# Patient Record
Sex: Female | Born: 1960 | Race: White | Hispanic: No | Marital: Married | Smoking: Former smoker
Health system: Southern US, Community
[De-identification: ages and names within clinical notes are randomized; demographics above are authoritative.]

## PROBLEM LIST (undated history)

## (undated) DIAGNOSIS — K219 Gastro-esophageal reflux disease without esophagitis: Secondary | ICD-10-CM

## (undated) DIAGNOSIS — M5090 Cervical disc disorder, unspecified, unspecified cervical region: Secondary | ICD-10-CM

## (undated) DIAGNOSIS — I809 Phlebitis and thrombophlebitis of unspecified site: Secondary | ICD-10-CM

## (undated) DIAGNOSIS — K2270 Barrett's esophagus without dysplasia: Secondary | ICD-10-CM

## (undated) DIAGNOSIS — F1110 Opioid abuse, uncomplicated: Secondary | ICD-10-CM

## (undated) DIAGNOSIS — F419 Anxiety disorder, unspecified: Secondary | ICD-10-CM

## (undated) DIAGNOSIS — F4310 Post-traumatic stress disorder, unspecified: Secondary | ICD-10-CM

## (undated) HISTORY — DX: Phlebitis and thrombophlebitis of unspecified site: I80.9

## (undated) HISTORY — PX: CYST REMOVAL HAND: SHX6279

## (undated) HISTORY — PX: WRIST SURGERY: SHX841

## (undated) HISTORY — DX: Barrett's esophagus without dysplasia: K22.70

## (undated) HISTORY — PX: SHOULDER SURGERY: SHX246

## (undated) HISTORY — DX: Cervical disc disorder, unspecified, unspecified cervical region: M50.90

## (undated) HISTORY — PX: CERVICAL SPINE SURGERY: SHX589

## (undated) HISTORY — DX: Opioid abuse, uncomplicated: F11.10

## (undated) HISTORY — DX: Gastro-esophageal reflux disease without esophagitis: K21.9

## (undated) HISTORY — DX: Post-traumatic stress disorder, unspecified: F43.10

## (undated) HISTORY — PX: FINGER SURGERY: SHX640

## (undated) HISTORY — PX: HIP SURGERY: SHX245

## (undated) HISTORY — DX: Anxiety disorder, unspecified: F41.9

---

## 2012-01-03 HISTORY — PX: COLONOSCOPY: SHX174

## 2013-10-21 DIAGNOSIS — S4380XA Sprain of other specified parts of unspecified shoulder girdle, initial encounter: Secondary | ICD-10-CM | POA: Insufficient documentation

## 2013-10-21 DIAGNOSIS — M75100 Unspecified rotator cuff tear or rupture of unspecified shoulder, not specified as traumatic: Secondary | ICD-10-CM | POA: Insufficient documentation

## 2014-02-24 DIAGNOSIS — R1313 Dysphagia, pharyngeal phase: Secondary | ICD-10-CM | POA: Insufficient documentation

## 2014-02-26 ENCOUNTER — Ambulatory Visit: Payer: Self-pay | Admitting: Gastroenterology

## 2014-03-17 ENCOUNTER — Ambulatory Visit: Payer: Self-pay | Admitting: Gastroenterology

## 2014-04-24 ENCOUNTER — Encounter: Payer: Self-pay | Admitting: Family Medicine

## 2014-04-27 LAB — SURGICAL PATHOLOGY

## 2014-05-08 DIAGNOSIS — M4722 Other spondylosis with radiculopathy, cervical region: Secondary | ICD-10-CM | POA: Insufficient documentation

## 2014-06-11 ENCOUNTER — Other Ambulatory Visit: Payer: Self-pay

## 2014-06-11 ENCOUNTER — Encounter: Payer: Self-pay | Admitting: Psychiatry

## 2014-06-11 ENCOUNTER — Ambulatory Visit (INDEPENDENT_AMBULATORY_CARE_PROVIDER_SITE_OTHER): Payer: 59 | Admitting: Psychiatry

## 2014-06-11 VITALS — BP 126/82 | HR 100 | Temp 99.3°F | Ht 62.0 in

## 2014-06-11 DIAGNOSIS — J069 Acute upper respiratory infection, unspecified: Secondary | ICD-10-CM | POA: Insufficient documentation

## 2014-06-11 DIAGNOSIS — K2270 Barrett's esophagus without dysplasia: Secondary | ICD-10-CM | POA: Insufficient documentation

## 2014-06-11 DIAGNOSIS — Z3490 Encounter for supervision of normal pregnancy, unspecified, unspecified trimester: Secondary | ICD-10-CM | POA: Insufficient documentation

## 2014-06-11 DIAGNOSIS — M5090 Cervical disc disorder, unspecified, unspecified cervical region: Secondary | ICD-10-CM | POA: Insufficient documentation

## 2014-06-11 DIAGNOSIS — F331 Major depressive disorder, recurrent, moderate: Secondary | ICD-10-CM | POA: Diagnosis not present

## 2014-06-11 DIAGNOSIS — O039 Complete or unspecified spontaneous abortion without complication: Secondary | ICD-10-CM | POA: Insufficient documentation

## 2014-06-11 DIAGNOSIS — K219 Gastro-esophageal reflux disease without esophagitis: Secondary | ICD-10-CM | POA: Insufficient documentation

## 2014-06-11 MED ORDER — LAMOTRIGINE 25 MG PO TABS: 25.0000 mg | ORAL_TABLET | Freq: Every day | ORAL | Status: DC

## 2014-06-11 MED ORDER — QUETIAPINE FUMARATE 50 MG PO TABS: 50.0000 mg | ORAL_TABLET | Freq: Two times a day (BID) | ORAL | Status: DC
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
## 2014-06-30 ENCOUNTER — Other Ambulatory Visit: Payer: Self-pay | Admitting: Psychiatry

## 2014-07-09 ENCOUNTER — Ambulatory Visit: Payer: 59 | Admitting: Psychiatry

## 2014-07-30 ENCOUNTER — Ambulatory Visit (INDEPENDENT_AMBULATORY_CARE_PROVIDER_SITE_OTHER): Payer: 59 | Admitting: Psychiatry

## 2014-07-30 ENCOUNTER — Encounter: Payer: Self-pay | Admitting: Psychiatry

## 2014-07-30 VITALS — BP 122/68 | HR 82 | Temp 98.4°F | Ht 62.0 in | Wt 154.8 lb

## 2014-07-30 DIAGNOSIS — F331 Major depressive disorder, recurrent, moderate: Secondary | ICD-10-CM

## 2014-07-30 MED ORDER — QUETIAPINE FUMARATE 100 MG PO TABS: 100.0000 mg | ORAL_TABLET | Freq: Every day | ORAL | Status: DC

## 2014-07-30 MED ORDER — BUSPIRONE HCL 10 MG PO TABS: 10.0000 mg | ORAL_TABLET | Freq: Two times a day (BID) | ORAL | Status: DC

## 2014-07-30 MED ORDER — PRAZOSIN HCL 1 MG PO CAPS: 1.0000 mg | ORAL_CAPSULE | Freq: Every day | ORAL | Status: DC

## 2014-07-30 MED ORDER — ESCITALOPRAM OXALATE 10 MG PO TABS: 10.0000 mg | ORAL_TABLET | Freq: Every day | ORAL | Status: DC
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
## 2014-07-31 DIAGNOSIS — M654 Radial styloid tenosynovitis [de Quervain]: Secondary | ICD-10-CM | POA: Insufficient documentation

## 2014-09-15 ENCOUNTER — Other Ambulatory Visit: Payer: Self-pay

## 2014-09-15 NOTE — Telephone Encounter (Signed)
left message with Kristen Curtis that she needs refills on lexapro, seroquel. pt last seen on  07-30-14 next appt on  10-08-14.  pt is out of meds.

## 2014-09-17 MED ORDER — QUETIAPINE FUMARATE 100 MG PO TABS: 100.0000 mg | ORAL_TABLET | Freq: Every day | ORAL | Status: DC

## 2014-09-17 MED ORDER — ESCITALOPRAM OXALATE 10 MG PO TABS: 10.0000 mg | ORAL_TABLET | Freq: Every day | ORAL | Status: DC

## 2014-10-01 ENCOUNTER — Ambulatory Visit: Payer: Self-pay | Admitting: Psychiatry

## 2014-10-08 ENCOUNTER — Ambulatory Visit: Payer: 59 | Admitting: Psychiatry

## 2014-10-08 ENCOUNTER — Encounter: Payer: Self-pay | Admitting: Psychiatry

## 2014-10-08 ENCOUNTER — Ambulatory Visit (INDEPENDENT_AMBULATORY_CARE_PROVIDER_SITE_OTHER): Payer: 59 | Admitting: Psychiatry

## 2014-10-08 VITALS — BP 118/82 | HR 103 | Temp 98.8°F | Ht 62.0 in | Wt 157.6 lb

## 2014-10-08 DIAGNOSIS — F411 Generalized anxiety disorder: Secondary | ICD-10-CM | POA: Diagnosis not present

## 2014-10-08 DIAGNOSIS — F330 Major depressive disorder, recurrent, mild: Secondary | ICD-10-CM

## 2014-10-08 MED ORDER — HYDROXYZINE PAMOATE 25 MG PO CAPS: 25.0000 mg | ORAL_CAPSULE | Freq: Two times a day (BID) | ORAL | Status: DC | PRN

## 2014-10-08 MED ORDER — BUSPIRONE HCL 10 MG PO TABS: 10.0000 mg | ORAL_TABLET | Freq: Every morning | ORAL | Status: DC

## 2014-10-08 MED ORDER — ESCITALOPRAM OXALATE 10 MG PO TABS: 10.0000 mg | ORAL_TABLET | Freq: Every day | ORAL | Status: DC

## 2014-10-08 MED ORDER — QUETIAPINE FUMARATE 50 MG PO TABS: 50.0000 mg | ORAL_TABLET | Freq: Two times a day (BID) | ORAL | Status: DC
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
## 2014-10-13 ENCOUNTER — Ambulatory Visit: Payer: Self-pay | Admitting: Family Medicine

## 2014-10-16 ENCOUNTER — Encounter: Payer: Self-pay | Admitting: Family Medicine

## 2014-10-16 ENCOUNTER — Ambulatory Visit (INDEPENDENT_AMBULATORY_CARE_PROVIDER_SITE_OTHER): Payer: 59 | Admitting: Family Medicine

## 2014-10-16 VITALS — BP 110/80 | HR 80 | Ht 62.0 in | Wt 155.0 lb

## 2014-10-16 DIAGNOSIS — R8299 Other abnormal findings in urine: Secondary | ICD-10-CM

## 2014-10-16 DIAGNOSIS — K2100 Gastro-esophageal reflux disease with esophagitis, without bleeding: Secondary | ICD-10-CM

## 2014-10-16 DIAGNOSIS — N3000 Acute cystitis without hematuria: Secondary | ICD-10-CM

## 2014-10-16 DIAGNOSIS — Z23 Encounter for immunization: Secondary | ICD-10-CM

## 2014-10-16 DIAGNOSIS — F418 Other specified anxiety disorders: Secondary | ICD-10-CM

## 2014-10-16 DIAGNOSIS — M797 Fibromyalgia: Secondary | ICD-10-CM | POA: Diagnosis not present

## 2014-10-16 DIAGNOSIS — R8290 Unspecified abnormal findings in urine: Secondary | ICD-10-CM

## 2014-10-16 DIAGNOSIS — F1110 Opioid abuse, uncomplicated: Secondary | ICD-10-CM

## 2014-10-16 DIAGNOSIS — E66811 Obesity, class 1: Secondary | ICD-10-CM | POA: Insufficient documentation

## 2014-10-16 DIAGNOSIS — F1111 Opioid abuse, in remission: Secondary | ICD-10-CM

## 2014-10-16 DIAGNOSIS — E559 Vitamin D deficiency, unspecified: Secondary | ICD-10-CM

## 2014-10-16 DIAGNOSIS — Z981 Arthrodesis status: Secondary | ICD-10-CM | POA: Diagnosis not present

## 2014-10-16 DIAGNOSIS — E669 Obesity, unspecified: Secondary | ICD-10-CM

## 2014-10-16 DIAGNOSIS — F4310 Post-traumatic stress disorder, unspecified: Secondary | ICD-10-CM | POA: Insufficient documentation

## 2014-10-16 LAB — POCT URINALYSIS DIPSTICK
BILIRUBIN UA: NEGATIVE
Blood, UA: NEGATIVE
Glucose, UA: NEGATIVE
KETONES UA: NEGATIVE
LEUKOCYTES UA: NEGATIVE
Nitrite, UA: NEGATIVE
Protein, UA: NEGATIVE
SPEC GRAV UA: 1.01
Urobilinogen, UA: 0.2
pH, UA: 6

## 2014-10-16 MED ORDER — HYDROXYZINE PAMOATE 25 MG PO CAPS: 25.0000 mg | ORAL_CAPSULE | Freq: Two times a day (BID) | ORAL | Status: DC | PRN

## 2014-10-16 NOTE — Progress Notes (Signed)
Date:  10/16/2014   Name:  Kristen Curtis   DOB:  [birth year]   MRN:  [id number]  PCP:  Schuyler AmorWilliam Wallace Cogliano, MD    Chief Complaint: Urinary Tract Infection and Fibromyalgia   History of Present Illness:  This is a 54 y.o. female reports cloudy urine past day, no other urinary sxs. Hx GERD with Barrett's on chronic PPI, last EGD showed no Barrett's per pt, interested in getting off PPI due to potential long term se's. Wants referral to rheum for fibromyalgia, initially dx'd 2001, sxs more severely lately. Tender over chest/back/hips/knees. Hx depression/anxiety/PTSD followed by Dr. Garnetta BuddyFaheem, generally well controlled on current regimen. Tetanus status unknown, gets flu imm at work. Has throat spasms well controlled on Levbid. No recent labs.  Review of Systems:  Review of Systems  Constitutional: Negative for fever and unexpected weight change.  Respiratory: Negative for cough and shortness of breath.   Cardiovascular: Negative for chest pain and leg swelling.  Gastrointestinal: Negative for abdominal pain.  Endocrine: Negative for polyuria.  Genitourinary: Negative for difficulty urinating.  Skin: Negative for rash.  Neurological: Negative for tremors, syncope, weakness, light-headedness and numbness.    Patient Active Problem List   Diagnosis Date Noted  . Fibromyalgia 10/16/2014  . Obesity (BMI 30.0-34.9) 10/16/2014  . PTSD (post-traumatic stress disorder) 10/16/2014  . Status post cervical spinal fusion 10/16/2014  . De Quervain's disease (radial styloid tenosynovitis) 07/31/2014  . Miscarriage 06/11/2014  . Barrett esophagus 06/11/2014  . Cervical disc disease 06/11/2014  . Acid reflux 06/11/2014  . Infection of the upper respiratory tract 06/11/2014  . Pregnancy 06/11/2014  . Cervical spondylosis with radiculopathy 05/08/2014  . Dysphagia, pharyngeal 02/24/2014  . Supraspinatus tendon tear 10/21/2013  . Detachment of glenoid labrum 10/21/2013    Prior to Admission  medications   Medication Sig Start Date End Date Taking? Authorizing Provider  busPIRone (BUSPAR) 10 MG tablet Take 1 tablet (10 mg total) by mouth every morning. 10/08/14  Yes Brandy HaleUzma Faheem, MD  escitalopram (LEXAPRO) 10 MG tablet Take 1 tablet (10 mg total) by mouth daily. 10/08/14  Yes Brandy HaleUzma Faheem, MD  hydrOXYzine (VISTARIL) 25 MG capsule Take 1 capsule (25 mg total) by mouth 2 (two) times daily as needed for anxiety. 10/16/14  Yes Schuyler AmorWilliam Miguelina Fore, MD  hyoscyamine (LEVBID) 0.375 MG 12 hr tablet  06/02/14  Yes Historical Provider, MD  omeprazole (PRILOSEC) 20 MG capsule TK ONE C PO QD 06/02/14  Yes Historical Provider, MD  QUEtiapine (SEROQUEL) 50 MG tablet Take 1 tablet (50 mg total) by mouth 2 (two) times daily. 10/08/14  Yes Brandy HaleUzma Faheem, MD    Allergies  Allergen Reactions  . Propoxyphene Rash  . Tetracycline Hives  . Tetracyclines & Related Hives  . Propoxyphene   . Tetracycline Hcl   . Tramadol Hives    Past Surgical History  Procedure Laterality Date  . Cervical spine surgery    . Shoulder surgery    . Finger surgery    . Cesarean section      x 2  . Hip surgery Left     x 2  . Colonoscopy  2014    no polyps- cleared for 10 yrs- Dr Colette RibasByrd  . Wrist surgery Right     DeQuervain surg  . Cyst removal hand Right     thumb    Social History  Substance Use Topics  . Smoking status: Former Smoker -- 0.25 packs/day    Types: Cigarettes    Start date: 10/07/1977  Quit date: 01/02/2013  . Smokeless tobacco: Never Used  . Alcohol Use: No    Family History  Problem Relation Age of Onset  . Lung cancer Mother   . Alcohol abuse Mother   . Cancer - Other Mother   . Lung cancer Maternal Grandmother   . Breast cancer Sister   . Prostate cancer Father   . Alcohol abuse Brother   . Drug abuse Brother     Medication list has been reviewed and updated.  Physical Examination: BP 110/80 mmHg  Pulse 80  Ht  (1.575 m)  Wt 155 lb (70.308 kg)  BMI 28.34 kg/m2  LMP    Physical Exam  Constitutional: She is oriented to person, place, and time. She appears well-developed and well-nourished.  Cardiovascular: Normal rate, regular rhythm and normal heart sounds.   Pulmonary/Chest: Effort normal and breath sounds normal.  Musculoskeletal: She exhibits no edema.  Trigger points trapezius, interscapular, paralumbar  Neurological: She is alert and oriented to person, place, and time. Coordination normal.  Skin: Skin is warm and dry.  Psychiatric: She has a normal mood and affect. Her behavior is normal.  Nursing note and vitals reviewed.   Assessment and Plan:  1. Cloudy urine UA negative will send for cx - Urine Culture - POCT urinalysis dipstick  2. Fibromyalgia Discussed trial of Lyrica, will wait to see what rheum recommends - Comprehensive metabolic panel - CBC - B12 - Ambulatory referral to Rheumatology  3. Gastroesophageal reflux disease with esophagitis Followed by GI, discuss taper off or conversion to H2 blocker next GI visit - Lipid panel - Vitamin D (25 hydroxy)  4. Obesity (BMI 30.0-34.9) Exercise/weight loss discussed  5. Depression with anxiety Followed by psych  6. Status post cervical spinal fusion No current neck pain  Return in about 4 weeks (around 11/13/2014).  [name] MD [facility]  10/16/2014

## 2014-10-17 LAB — COMPREHENSIVE METABOLIC PANEL
ALBUMIN: 4.4 g/dL (ref 3.5–5.5)
ALK PHOS: 99 IU/L (ref 39–117)
ALT: 12 IU/L (ref 0–32)
AST: 15 IU/L (ref 0–40)
Albumin/Globulin Ratio: 1.8 (ref 1.1–2.5)
BILIRUBIN TOTAL: 0.2 mg/dL (ref 0.0–1.2)
BUN / CREAT RATIO: 14 (ref 9–23)
BUN: 12 mg/dL (ref 6–24)
CHLORIDE: 100 mmol/L (ref 97–108)
CO2: 26 mmol/L (ref 18–29)
Calcium: 10 mg/dL (ref 8.7–10.2)
Creatinine, Ser: 0.86 mg/dL (ref 0.57–1.00)
GFR calc Af Amer: 90 mL/min/{1.73_m2} (ref >59)
GFR calc non Af Amer: 78 mL/min/{1.73_m2} (ref >59)
GLOBULIN, TOTAL: 2.5 g/dL (ref 1.5–4.5)
Glucose: 65 mg/dL (ref 65–99)
Potassium: 5.4 mmol/L — ABNORMAL HIGH (ref 3.5–5.2)
SODIUM: 140 mmol/L (ref 134–144)
Total Protein: 6.9 g/dL (ref 6.0–8.5)

## 2014-10-17 LAB — CBC
Hematocrit: 41.4 % (ref 34.0–46.6)
Hemoglobin: 13.9 g/dL (ref 11.1–15.9)
MCH: 32.2 pg (ref 26.6–33.0)
MCHC: 33.6 g/dL (ref 31.5–35.7)
MCV: 96 fL (ref 79–97)
PLATELETS: 326 10*3/uL (ref 150–379)
RBC: 4.32 x10E6/uL (ref 3.77–5.28)
RDW: 13.9 % (ref 12.3–15.4)
WBC: 11 10*3/uL — AB (ref 3.4–10.8)

## 2014-10-17 LAB — LIPID PANEL
Chol/HDL Ratio: 4.5 ratio units — ABNORMAL HIGH (ref 0.0–4.4)
Cholesterol, Total: 276 mg/dL — ABNORMAL HIGH (ref 100–199)
HDL: 62 mg/dL (ref >39)
LDL Calculated: 163 mg/dL — ABNORMAL HIGH (ref 0–99)
TRIGLYCERIDES: 256 mg/dL — AB (ref 0–149)
VLDL Cholesterol Cal: 51 mg/dL — ABNORMAL HIGH (ref 5–40)

## 2014-10-17 LAB — VITAMIN B12: Vitamin B-12: 1190 pg/mL — ABNORMAL HIGH (ref 211–946)

## 2014-10-17 LAB — VITAMIN D 25 HYDROXY (VIT D DEFICIENCY, FRACTURES): VIT D 25 HYDROXY: 22.1 ng/mL — AB (ref 30.0–100.0)

## 2014-10-18 LAB — URINE CULTURE

## 2014-10-19 ENCOUNTER — Telehealth: Payer: Self-pay

## 2014-10-19 ENCOUNTER — Other Ambulatory Visit: Payer: Self-pay | Admitting: Family Medicine

## 2014-10-19 DIAGNOSIS — E559 Vitamin D deficiency, unspecified: Secondary | ICD-10-CM | POA: Insufficient documentation

## 2014-10-19 MED ORDER — SULFAMETHOXAZOLE-TRIMETHOPRIM 800-160 MG PO TABS: 1.0000 | ORAL_TABLET | Freq: Two times a day (BID) | ORAL | Status: DC

## 2014-10-19 MED ORDER — VITAMIN D 50 MCG (2000 UT) PO CAPS: 1.0000 | ORAL_CAPSULE | Freq: Every day | ORAL | Status: AC

## 2014-10-19 NOTE — Telephone Encounter (Signed)
Sent message

## 2014-10-19 NOTE — Addendum Note (Signed)
Addended by: Schuyler AmorPLONK, Shaquanda Graves on: 10/19/2014 10:29 AM   Modules accepted: Orders, SmartSet

## 2014-10-19 NOTE — Telephone Encounter (Signed)
OK 

## 2014-10-19 NOTE — Telephone Encounter (Signed)
See note attached to urine cx, Bactrim rx sent to pharmacy.

## 2014-11-26 ENCOUNTER — Other Ambulatory Visit: Payer: Self-pay | Admitting: Psychiatry

## 2014-12-08 ENCOUNTER — Ambulatory Visit: Payer: 59 | Admitting: Psychiatry

## 2014-12-20 DIAGNOSIS — M67439 Ganglion, unspecified wrist: Secondary | ICD-10-CM | POA: Insufficient documentation

## 2014-12-29 NOTE — Progress Notes (Signed)
rx was discontinued.  Called pharmacy to notified.

## 2014-12-30 ENCOUNTER — Other Ambulatory Visit: Payer: Self-pay | Admitting: Psychiatry

## 2014-12-31 ENCOUNTER — Telehealth: Payer: Self-pay

## 2014-12-31 NOTE — Telephone Encounter (Signed)
pt called states she needs a refill on her seroquel.  pt states she will not have enough to do until her next appt on  01-04-14

## 2014-12-31 NOTE — Telephone Encounter (Signed)
called in enough medication to due until pt can come in for appt.  pt next appt  01-04-14 last seen on  10-08-14.  rx was called in for #12 seroquel 50mg  take one twice daily.

## 2014-12-31 NOTE — Telephone Encounter (Signed)
spoke with pt. pt was told that there was no doctor in the office until 01-04-14.  called in just enought to due until her appt on  01-04-14

## 2015-01-05 ENCOUNTER — Encounter: Payer: Self-pay | Admitting: Psychiatry

## 2015-01-05 ENCOUNTER — Ambulatory Visit (INDEPENDENT_AMBULATORY_CARE_PROVIDER_SITE_OTHER): Payer: 59 | Admitting: Psychiatry

## 2015-01-05 VITALS — BP 118/72 | HR 100 | Temp 99.3°F | Ht 62.0 in | Wt 159.8 lb

## 2015-01-05 DIAGNOSIS — F331 Major depressive disorder, recurrent, moderate: Secondary | ICD-10-CM

## 2015-01-05 MED ORDER — QUETIAPINE FUMARATE 50 MG PO TABS: 50.0000 mg | ORAL_TABLET | Freq: Three times a day (TID) | ORAL | Status: DC

## 2015-01-05 NOTE — Progress Notes (Signed)
Pharmacy notified.

## 2015-01-05 NOTE — Progress Notes (Signed)
BH  MD  Progress Note  Patient Identification: Kristen Curtis MRN:  [id number] Date of Evaluation:  01/05/2015 [facility]  Chief Complaint:   Chief Complaint    Follow-up; Medication Refill     Visit Diagnosis:    ICD-9-CM ICD-10-CM   1. MDD (major depressive disorder), recurrent episode, moderate (HCC) 296.32 F33.1    Diagnosis:   Patient Active Problem List   Diagnosis Date Noted  . Ganglion cyst of wrist [M67.439] 12/20/2014  . Vitamin D deficiency [E55.9] 10/19/2014  . Fibromyalgia [M79.7] 10/16/2014  . PTSD (post-traumatic stress disorder) [F43.10] 10/16/2014  . Status post cervical spinal fusion [Z98.1] 10/16/2014  . Depression with anxiety [F41.8] 10/16/2014  . Obesity, Class I, BMI 30-34.9 [E66.9] 10/16/2014  . Hx of opioid abuse [F11.10] 10/16/2014  . Barrett esophagus [K22.70] 06/11/2014  . Acid reflux [K21.9] 06/11/2014  . Dysphagia, pharyngeal [R13.13] 02/24/2014   History of Present Illness:    Pt is a 55 yo married female who presented for follow up. Patient reported that she recently had the surgery in her right hand due to the reaction to the medication. She reported that they worked on the fascia and the compartment syndrome in her hand. She is recuperating well from her surgery. Patient reported that she has gradually tapered herself out of the Lexapro and is only taking the Seroquel at bedtime. Patient stated that the medication is helping her well. She takes 100 mg of Seroquel at bedtime and 50 mg on a when necessary basis during the day when she feels anxious She reported that her anxiety symptoms are under control. She does not want to take several different medications. She has never tried Vistaril which was prescribed for her anxiety symptoms. She reported that her mood symptoms are  improving. She currently denied having any anxiety anger or paranoia. She appeared calm during the interview.   Past Medical History:  Past Medical  History  Diagnosis Date  . Barrett's esophagus   . Esophageal reflux   . Cervical disc disease   . PTSD (post-traumatic stress disorder)   . Opioid abuse   . Phlebitis   . Anxiety     Past Surgical History  Procedure Laterality Date  . Cervical spine surgery    . Shoulder surgery    . Finger surgery    . Cesarean section      x 2  . Hip surgery Left     x 2  . Colonoscopy  2014    no polyps- cleared for 10 yrs- Dr Colette RibasByrd  . Wrist surgery Right     DeQuervain surg  . Cyst removal hand Right     thumb   Family History:  Family History  Problem Relation Age of Onset  . Lung cancer Mother   . Alcohol abuse Mother   . Cancer - Other Mother   . Lung cancer Maternal Grandmother   . Breast cancer Sister   . Prostate cancer Father   . Alcohol abuse Brother   . Drug abuse Brother    Social History:   Social History   Social History  . Marital Status: Married    Spouse Name: N/A  . Number of Children: 2  . Years of Education: N/A   Occupational History  . unemployed    Social History Main Topics  . Smoking status: Former Smoker -- 0.25 packs/day    Types: Cigarettes    Start date: 10/07/1977    Quit date: 01/02/2013  .  Smokeless tobacco: Never Used  . Alcohol Use: No  . Drug Use: No     Comment: uses to smoke marjuana over a year ago  . Sexual Activity: Yes    Birth Control/ Protection: None   Other Topics Concern  . None   Social History Narrative   Additional Social History:   Married x 3 years. No children. 2 children from previous marriage. Husband works in [employer] representative. Moved to [place] - 6 years ago. She was born in [place] and relocated to [facility]. She has been married x 3. Has no pending legal charges.   Musculoskeletal: Strength & Muscle Tone: within normal limits Gait & Station: normal Patient leans: N/A  Psychiatric Specialty Exam: Anxiety Symptoms include nervous/anxious behavior and palpitations.      Review of Systems  Cardiovascular:  Positive for palpitations.  Musculoskeletal: Positive for neck pain.  Psychiatric/Behavioral: Positive for depression. The patient is nervous/anxious.   All other systems reviewed and are negative.   Blood pressure 118/72, pulse 100, temperature 99.3 F (37.4 C), temperature source Tympanic, height 5\' 2"  (1.575 m), weight 159 lb 12.8 oz (72.485 kg), SpO2 91 %.Body mass index is 29.22 kg/(m^2).  General Appearance: Casual  Eye Contact:  Fair  Speech:  Slow  Volume:  Normal  Mood:  Depressed  Affect:  Congruent  Thought Process:  Coherent and Logical  Orientation:  Full (Time, Place, and Person)  Thought Content:  WDL  Suicidal Thoughts:  No  Homicidal Thoughts:  No  Memory:  Immediate;   Fair  Judgement:  Fair  Insight:  Fair  Psychomotor Activity:  Normal  Concentration:  Fair  Recall:  [employer] of Knowledge:Fair  Language: Fair  Akathisia:  No  Handed:  Right  AIMS (if indicated):  none  Assets:  Communication Skills Desire for Improvement  ADL's:  Intact  Cognition: WNL  Sleep:  5   Is the patient at risk to self?  No. Has the patient been a risk to self in the past 6 months?  No. Has the patient been a risk to self within the distant past?  No. Is the patient a risk to others?  No. Has the patient been a risk to others in the past 6 months?  No. Has the patient been a risk to others within the distant past?  No.  Allergies:   Allergies  Allergen Reactions  . Ciprofloxacin Other (See Comments)    CAUSED DAMAGE TO TENDONS IN HANDS  . Dexbrompheniramine-Pseudoeph Hives  . Methadone Rash  . Other Hives  . Propoxyphene Rash  . Propoxyphene Rash  . Tetracycline Hives  . Tetracyclines & Related Hives  . Tetracycline Hcl   . Tramadol Hives   Current Medications: Current Outpatient Prescriptions  Medication Sig Dispense Refill  . baclofen (LIORESAL) 10 MG tablet TK 1 T PO TID.  0  . busPIRone (BUSPAR) 10 MG tablet Take 1 tablet (10 mg total) by mouth every  morning. 30 tablet 1  . Cholecalciferol (VITAMIN D) 2000 UNITS CAPS Take 1 capsule (2,000 Units total) by mouth daily. 30 capsule   . escitalopram (LEXAPRO) 10 MG tablet Take 1 tablet (10 mg total) by mouth daily. 30 tablet 1  . HYDROcodone-acetaminophen (NORCO) 10-325 MG tablet TK 1 T PO  Q 6 H PRN P  0  . hydrOXYzine (VISTARIL) 25 MG capsule Take 1 capsule (25 mg total) by mouth 2 (two) times daily as needed for anxiety. 60 capsule 0  . hyoscyamine (LEVBID)  0.375 MG 12 hr tablet   0  . omeprazole (PRILOSEC) 20 MG capsule TK ONE C PO QD  11  . oxyCODONE (OXY IR/ROXICODONE) 5 MG immediate release tablet TK 1 T PO  Q 4 H PRN  0  . QUEtiapine (SEROQUEL) 50 MG tablet TAKE 1 TABLET(50 MG) BY MOUTH TWICE DAILY 60 tablet 0  . sulfamethoxazole-trimethoprim (BACTRIM DS,SEPTRA DS) 800-160 MG tablet Take 1 tablet by mouth 2 (two) times daily. 6 tablet 0   No current facility-administered medications for this visit.      Substance Abuse History in the last 12 months:  No.  Consequences of Substance Abuse: Negative  Medical Decision Making:  Established Problem, Worsening (2)  Treatment Plan Summary: Medication management   Discussed with patient or the medications treatment risk benefits and alternatives  Mood symptoms She will continue on Seroquel 100 mg at bedtime and 25-50 mg during the day as needed for mood and anxiety symptoms.    She demonstrated understanding She will  follow-up in three  month or earlier  depending on her symptoms      Atypicals: The patient was counseled on the risks, benefits, and alternatives to treatment with an atypical antipsychotic agent.  Risks discussed include metabolic side effects: weight gain, elevations in blood sugar and lipids, and increased risk of diabetes.  The patient was also advised of the risks of dystonia, akathisia, parkinsonism, tardive dyskinesia, and prolactin elevation.  The patient was able to demonstrate understanding of these  risks and provided informed consent to start the medication.     More than 50% of the time spent in psychoeducation, counseling and coordination of care.  Time spent with the patient 25 minutes   This note was generated in part or whole with voice recognition software. Voice regonition is usually quite accurate but there are transcription errors that can and very often do occur. I apologize for any typographical errors that were not detected and corrected.    Brandy Hale, MD    1/3/20179:24 AM

## 2015-01-05 NOTE — Telephone Encounter (Signed)
Done

## 2015-02-03 ENCOUNTER — Encounter: Payer: Self-pay | Admitting: Oncology

## 2015-02-03 ENCOUNTER — Inpatient Hospital Stay: Payer: Managed Care, Other (non HMO) | Attending: Oncology | Admitting: Oncology

## 2015-02-03 DIAGNOSIS — Z79899 Other long term (current) drug therapy: Secondary | ICD-10-CM | POA: Diagnosis not present

## 2015-02-03 DIAGNOSIS — Z1589 Genetic susceptibility to other disease: Secondary | ICD-10-CM | POA: Insufficient documentation

## 2015-02-03 DIAGNOSIS — Z87891 Personal history of nicotine dependence: Secondary | ICD-10-CM | POA: Insufficient documentation

## 2015-02-03 DIAGNOSIS — F1110 Opioid abuse, uncomplicated: Secondary | ICD-10-CM | POA: Insufficient documentation

## 2015-02-03 DIAGNOSIS — F4310 Post-traumatic stress disorder, unspecified: Secondary | ICD-10-CM | POA: Diagnosis not present

## 2015-02-03 DIAGNOSIS — K2270 Barrett's esophagus without dysplasia: Secondary | ICD-10-CM | POA: Insufficient documentation

## 2015-02-03 DIAGNOSIS — F419 Anxiety disorder, unspecified: Secondary | ICD-10-CM | POA: Diagnosis not present

## 2015-02-03 DIAGNOSIS — Z808 Family history of malignant neoplasm of other organs or systems: Secondary | ICD-10-CM | POA: Insufficient documentation

## 2015-02-03 DIAGNOSIS — Z800 Family history of malignant neoplasm of digestive organs: Secondary | ICD-10-CM | POA: Diagnosis not present

## 2015-02-03 DIAGNOSIS — Z315 Encounter for genetic counseling: Secondary | ICD-10-CM | POA: Insufficient documentation

## 2015-02-03 DIAGNOSIS — K219 Gastro-esophageal reflux disease without esophagitis: Secondary | ICD-10-CM | POA: Diagnosis not present

## 2015-02-03 DIAGNOSIS — Z803 Family history of malignant neoplasm of breast: Secondary | ICD-10-CM | POA: Diagnosis not present

## 2015-02-03 DIAGNOSIS — M5030 Other cervical disc degeneration, unspecified cervical region: Secondary | ICD-10-CM | POA: Insufficient documentation

## 2015-02-07 NOTE — Progress Notes (Signed)
[facility]  Telephone:(336) [phone] Fax:(336) [phone]  ID: Kristen Curtis OB: 1960/03/23  MR#: [id number]  AOZ#:[id number]  [name], [name]  CHIEF COMPLAINT:  Chief Complaint  Patient presents with  . Genetic Evaluation  . New Evaluation    INTERVAL HISTORY: Patient is a 55 year old female who is referred for further evaluation and consideration of genetic testing. She has a sister who was diagnosed with breast cancer in her 55s and was found to be BCRA positive. Patient has no personal history of malignancy. She currently feels well and is asymptomatic. Her father had prostate cancer at age 37, but the remainder of her father's family history is unknown. She had a maternal great aunt as well as maternal cousin both with breast cancer. Her maternal grandmother had bladder and lung cancer. Mother was diagnosed with lung cancer twice. Patient feels at her baseline and offers no specific complaints today.  REVIEW OF SYSTEMS:   Review of Systems  Constitutional: Negative.   Respiratory: Negative.   Cardiovascular: Negative.   Gastrointestinal: Negative.   Musculoskeletal: Negative.   Neurological: Negative.     As per HPI. Otherwise, a complete review of systems is negatve.  PAST MEDICAL HISTORY: Past Medical History  Diagnosis Date  . Barrett's esophagus   . Esophageal reflux   . Cervical disc disease   . PTSD (post-traumatic stress disorder)   . Opioid abuse   . Phlebitis   . Anxiety     PAST SURGICAL HISTORY: Past Surgical History  Procedure Laterality Date  . Cervical spine surgery    . Shoulder surgery    . Finger surgery    . Cesarean section      x 2  . Hip surgery Left     x 2  . Colonoscopy  2014    no polyps- cleared for 10 yrs- Dr Colette Ribas  . Wrist surgery Right     DeQuervain surg  . Cyst removal hand Right     thumb    FAMILY HISTORY Family History  Problem Relation Age  of Onset  . Lung cancer Mother 52  . Alcohol abuse Mother   . Cancer - Other Mother   . Lung cancer Maternal Grandmother   . Breast cancer Sister 71    age 53 and 56  . Prostate cancer Father 55  . Alcohol abuse Brother   . Drug abuse Brother   . Prostate cancer Maternal Uncle 60  . Bladder Cancer Maternal Grandmother 72  . Lung cancer Paternal Grandmother 29       ADVANCED DIRECTIVES:    HEALTH MAINTENANCE: Social History  Substance Use Topics  . Smoking status: Former Smoker -- 0.25 packs/day    Types: Cigarettes    Start date: 10/07/1977    Quit date: 01/02/2013  . Smokeless tobacco: Never Used  . Alcohol Use: No     Colonoscopy:  PAP:  Bone density:  Lipid panel:  Allergies  Allergen Reactions  . Ciprofloxacin Other (See Comments)    Ruptured tendon CAUSED DAMAGE TO TENDONS IN HANDS  . Dexbrompheniramine-Pseudoeph Hives  . Other Hives  . Propoxyphene Rash  . Propoxyphene Rash  . Tetracycline Hives  . Tetracyclines & Related Hives  . Tetracycline Hcl   . Tramadol Hives    Current Outpatient Prescriptions  Medication Sig Dispense Refill  . baclofen (LIORESAL) 10 MG tablet TK 1 T PO TID.  0  . Biotin 1000 MCG tablet Take  by mouth.    . Cholecalciferol (VITAMIN D) 2000 UNITS CAPS Take 1 capsule (2,000 Units total) by mouth daily. 30 capsule   . Cyanocobalamin (VITAMIN B 12) 100 MCG LOZG Take by mouth.    . hyoscyamine (LEVBID) 0.375 MG 12 hr tablet   0  . nystatin cream (MYCOSTATIN) APP TOPICALLY BID  0  . omeprazole (PRILOSEC) 20 MG capsule TK ONE C PO QD  11  . QUEtiapine (SEROQUEL) 50 MG tablet Take 1 tablet (50 mg total) by mouth 3 (three) times daily. 90 tablet 2  . HYDROcodone-acetaminophen (NORCO) 10-325 MG tablet Reported on 02/03/2015  0  . oxyCODONE (OXY IR/ROXICODONE) 5 MG immediate release tablet Reported on 02/03/2015  0   No current facility-administered medications for this visit.    OBJECTIVE: There were no vitals filed for this visit.    There is no weight on file to calculate BMI.    ECOG FS:0 - Asymptomatic  General: Well-developed, well-nourished, no acute distress. Eyes: Pink conjunctiva, anicteric sclera. Musculoskeletal: No edema, cyanosis, or clubbing. Neuro: Alert, answering all questions appropriately. Cranial nerves grossly intact. Skin: No rashes or petechiae noted. Psych: Normal affect.  LAB RESULTS:  Lab Results  Component Value Date   NA 140 10/16/2014   K 5.4* 10/16/2014   CL 100 10/16/2014   CO2 26 10/16/2014   GLUCOSE 65 10/16/2014   BUN 12 10/16/2014   CREATININE 0.86 10/16/2014   CALCIUM 10.0 10/16/2014   PROT 6.9 10/16/2014   ALBUMIN 4.4 10/16/2014   AST 15 10/16/2014   ALT 12 10/16/2014   ALKPHOS 99 10/16/2014   BILITOT 0.2 10/16/2014   GFRNONAA 78 10/16/2014   GFRAA 90 10/16/2014    Lab Results  Component Value Date   WBC 11.0* 10/16/2014   HCT 41.4 10/16/2014   MCV 96 10/16/2014   PLT 326 10/16/2014     STUDIES: No results found.  ASSESSMENT: Family history BCRA positive.  PLAN:    1. Family history: Unclear if BCRA gene is paternal or maternal. Patient has no personal history of malignancy, but given a sister with breast cancer in her 81s and BCRA positive, it was recommended that patient proceed with testing. We have asked that she obtain her sister's official results. Patient has previously stated and continues to say that she will refuse any further mammograms even if her genetic testing is positive. She is also has stated she will likely refuse breast MRI.  She would consider bilateral mastectomy with immediate reconstruction for prophylaxis. Patient has tubal ligation and is also postmenopausal, therefore we also discussed the possibility of a total hysterectomy for ovarian cancer prophylaxis if she is positive. She has 2 sons and has agreed to recommend them to get testing if her testing is positive. Have also recommended that her brother be tested. No follow-up has been  scheduled at this time. If positive, patient will return to clinic to discuss the results and malignancy prophylactic options. If negative, patient understands she is at higher risk than the general population for developing breast cancer given her family history, but she stated again she will likely continue to refuse mammograms AGAINST MEDICAL ADVICE.  Approximately 45 minutes was spent in discussion of which greater than 50% was consultation.  Patient expressed understanding and was in agreement with this plan. She also understands that She can call clinic at any time with any questions, concerns, or complaints.   Jeralyn Ruths, MD   02/07/2015 9:19 AM

## 2015-03-18 ENCOUNTER — Telehealth: Payer: Self-pay | Admitting: Psychiatry

## 2015-03-18 NOTE — Telephone Encounter (Signed)
Called Dr Levan [facility]ristry Bark at [facility]. She is her Rheumatologist. She Lm earlier and stated that pt has been requesting Ambien as she was c/o insomnia. Dr Leontine LocketBark started her on Ambien 5 mg daily. She was given 4 months supply.

## 2015-04-06 ENCOUNTER — Ambulatory Visit: Payer: Self-pay | Admitting: Psychiatry

## 2015-04-15 ENCOUNTER — Encounter: Payer: Self-pay | Admitting: *Deleted

## 2015-04-15 NOTE — Progress Notes (Signed)
Patient informed of genetic testing results. No follow up needed.

## 2015-04-26 ENCOUNTER — Ambulatory Visit: Payer: Self-pay | Admitting: Psychiatry

## 2015-11-08 ENCOUNTER — Encounter: Payer: Self-pay | Admitting: Psychiatry

## 2015-11-08 ENCOUNTER — Ambulatory Visit (INDEPENDENT_AMBULATORY_CARE_PROVIDER_SITE_OTHER): Payer: Self-pay | Admitting: Psychiatry

## 2015-11-08 VITALS — BP 100/62 | HR 77 | Ht 62.0 in | Wt 134.4 lb

## 2015-11-08 DIAGNOSIS — F39 Unspecified mood [affective] disorder: Secondary | ICD-10-CM

## 2015-11-08 MED ORDER — QUETIAPINE FUMARATE 50 MG PO TABS: 50.0000 mg | ORAL_TABLET | Freq: Every day | ORAL | 1 refills | Status: DC

## 2015-11-08 NOTE — Progress Notes (Signed)
BH  MD  Progress Note  Patient Identification: Kristen Curtis MRN:  [id number] Date of Evaluation:  11/08/2015 Referral Source: [facility]  Chief Complaint:    Visit Diagnosis:    ICD-9-CM ICD-10-CM   1. Episodic mood disorder (HCC) 296.90 F39    Diagnosis:   Patient Active Problem List   Diagnosis Date Noted  . Ganglion cyst of wrist [M67.439] 12/20/2014  . Vitamin D deficiency [E55.9] 10/19/2014  . Fibromyalgia [M79.7] 10/16/2014  . PTSD (post-traumatic stress disorder) [F43.10] 10/16/2014  . Status post cervical spinal fusion [Z98.1] 10/16/2014  . Depression with anxiety [F41.8] 10/16/2014  . Obesity, Class I, BMI 30-34.9 [E66.9] 10/16/2014  . Hx of opioid abuse [Z87.898] 10/16/2014  . Barrett esophagus [K22.70] 06/11/2014  . Acid reflux [K21.9] 06/11/2014  . Dysphagia, pharyngeal [R13.13] 02/24/2014   History of Present Illness:    Pt is a 55 yo married female who presented for follow up. She was last seen in January. She reported that she was having a surgery  in her shoulder. She reported that she was [facility] who has started her on Paxil. Patient reported that she was taking Seroquel in the past and initially it caused her weight gain but now she wants to be restarted back on the medication as she tolerated the medication was helpful. She reported that she was taking Seroquel up to 3 times daily in the past. She feels that the medication helped with her anxiety symptoms. She was talking about having panic attacks although she appeared calm and alert during the interview. She currently denied having any suicidal ideations or plans. She is planning to help and the surgery in her shoulder due to chronic pain. She denied having any suicidal ideations or plans. She denied having any perceptual disturbances at this time.     Past Medical History:  Past Medical History:  Diagnosis Date  . Anxiety   . Barrett's esophagus   . Cervical  disc disease   . Esophageal reflux   . Opioid abuse   . Phlebitis   . PTSD (post-traumatic stress disorder)     Past Surgical History:  Procedure Laterality Date  . CERVICAL SPINE SURGERY    . CESAREAN SECTION     x 2  . COLONOSCOPY  2014   no polyps- cleared for 10 yrs- Dr Colette RibasByrd  . CYST REMOVAL HAND Right    thumb  . FINGER SURGERY    . HIP SURGERY Left    x 2  . SHOULDER SURGERY    . WRIST SURGERY Right    DeQuervain surg   Family History:  Family History  Problem Relation Age of Onset  . Lung cancer Mother 6667  . Alcohol abuse Mother   . Cancer - Other Mother   . Lung cancer Maternal Grandmother   . Breast cancer Sister 2144    age 53 and 3849  . Prostate cancer Father 3054  . Alcohol abuse Brother   . Drug abuse Brother   . Prostate cancer Maternal Uncle 60  . Bladder Cancer Maternal Grandmother 72  . Lung cancer Paternal Grandmother 3777   Social History:   Social History   Social History  . Marital status: Married    Spouse name: N/A  . Number of children: 2  . Years of education: N/A   Occupational History  . unemployed    Social History Main Topics  . Smoking status: Former Smoker    Packs/day:  0.10    Types: Cigarettes    Start date: 10/07/1977    Quit date: 01/02/2013  . Smokeless tobacco: Never Used  . Alcohol use No  . Drug use: No     Comment: uses to smoke marjuana over a year ago  . Sexual activity: Yes    Partners: Male    Birth control/ protection: None, Post-menopausal   Other Topics Concern  . None   Social History Narrative  . None   Additional Social History:   Married x 3 years. No children. 2 children from previous marriage. Husband works in [employer] representative[employer]. Moved to [place] 6 years ago. [place] She has been married x 3. Has no pending legal charges.   Musculoskeletal: Strength & Muscle Tone: within normal limits Gait & Station: normal Patient leans: N/A  Psychiatric Specialty Exam: Anxiety   Symptoms include nervous/anxious behavior and palpitations.      Review of Systems  Cardiovascular: Positive for palpitations.  Musculoskeletal: Positive for neck pain.  Psychiatric/Behavioral: Positive for depression. The patient is nervous/anxious.   All other systems reviewed and are negative.   Blood pressure 100/62, pulse 77, height 5\' 2"  (1.575 m), weight 134 lb 6.4 oz (61 kg).Body mass index is 24.58 kg/m.  General Appearance: Casual  Eye Contact:  Fair  Speech:  Slow  Volume:  Normal  Mood:  Euthymic  Affect:  Congruent  Thought Process:  Coherent and Logical  Orientation:  Full (Time, Place, and Person)  Thought Content:  WDL  Suicidal Thoughts:  No  Homicidal Thoughts:  No  Memory:  Immediate;   Fair  Judgement:  Fair  Insight:  Fair  Psychomotor Activity:  Normal  Concentration:  Fair  Recall:  [employer]Fair  Fund of Knowledge:Fair  Language: Fair  Akathisia:  No  Handed:  Right  AIMS (if indicated):  none  Assets:  Communication Skills Desire for Improvement  ADL's:  Intact  Cognition: WNL  Sleep:  5   Is the patient at risk to self?  No. Has the patient been a risk to self in the past 6 months?  No. Has the patient been a risk to self within the distant past?  No. Is the patient a risk to others?  No. Has the patient been a risk to others in the past 6 months?  No. Has the patient been a risk to others within the distant past?  No.  Allergies:   Allergies  Allergen Reactions  . Ciprofloxacin Other (See Comments)    Ruptured tendon CAUSED DAMAGE TO TENDONS IN HANDS  . Dexbrompheniramine-Pseudoeph Hives  . Other Hives  . Propoxyphene Rash  . Propoxyphene Rash  . Tetracycline Hives  . Tetracyclines & Related Hives  . Tetracycline Hcl   . Tramadol Hives   Current Medications: Current Outpatient Prescriptions  Medication Sig Dispense Refill  . baclofen (LIORESAL) 10 MG tablet TK 1 T PO TID.  0  . Biotin 1000 MCG tablet Take by mouth.    .  Cholecalciferol (VITAMIN D) 2000 UNITS CAPS Take 1 capsule (2,000 Units total) by mouth daily. 30 capsule   . Cyanocobalamin (VITAMIN B 12) 100 MCG LOZG Take by mouth.    . hyoscyamine (LEVBID) 0.375 MG 12 hr tablet   0  . nystatin cream (MYCOSTATIN) APP TOPICALLY BID  0  . omeprazole (PRILOSEC) 20 MG capsule TK ONE C PO QD  11  . QUEtiapine (SEROQUEL) 50 MG tablet Take 1 tablet (  50 mg total) by mouth at bedtime. 30 tablet 1   No current facility-administered medications for this visit.       Substance Abuse History in the last 12 months:  No.  Consequences of Substance Abuse: Negative  Medical Decision Making:  Established Problem, Worsening (2)  Treatment Plan Summary: Medication management   Discussed with patient or the medications treatment risk benefits and alternatives  Mood symptoms She will continue on Seroquel 50 mg at bedtime  she is also taking Paxil 10 mg daily.  She will  follow-up in 2 weeks  or earlier  depending on her symptoms    This note was generated in part or whole with voice recognition software. Voice regonition is usually quite accurate but there are transcription errors that can and very often do occur. I apologize for any typographical errors that were not detected and corrected.    Brandy Hale, MD    11/6/20171:32 PM

## 2015-11-23 ENCOUNTER — Encounter: Payer: Self-pay | Admitting: Psychiatry

## 2015-11-23 ENCOUNTER — Ambulatory Visit (INDEPENDENT_AMBULATORY_CARE_PROVIDER_SITE_OTHER): Payer: 59 | Admitting: Psychiatry

## 2015-11-23 VITALS — BP 102/64 | HR 98 | Ht 62.0 in | Wt 137.2 lb

## 2015-11-23 DIAGNOSIS — F411 Generalized anxiety disorder: Secondary | ICD-10-CM | POA: Diagnosis not present

## 2015-11-23 DIAGNOSIS — F39 Unspecified mood [affective] disorder: Secondary | ICD-10-CM

## 2015-11-23 MED ORDER — QUETIAPINE FUMARATE 100 MG PO TABS: 100.0000 mg | ORAL_TABLET | Freq: Every day | ORAL | 2 refills | Status: AC

## 2015-11-23 MED ORDER — QUETIAPINE FUMARATE 50 MG PO TABS: 50.0000 mg | ORAL_TABLET | Freq: Every day | ORAL | 1 refills | Status: DC

## 2015-11-23 NOTE — Progress Notes (Signed)
BH  MD  Progress Note  Patient Identification: Kristen Curtis MRN:  [id number] Date of Evaluation:  11/23/2015 Referral Source: [facility].  Chief Complaint:   Chief Complaint    Follow-up     Visit Diagnosis:    ICD-9-CM ICD-10-CM   1. Episodic mood disorder (HCC) 296.90 F39   2. GAD (generalized anxiety disorder) 300.02 F41.1    Diagnosis:   Patient Active Problem List   Diagnosis Date Noted  . Ganglion cyst of wrist [M67.439] 12/20/2014  . Vitamin D deficiency [E55.9] 10/19/2014  . Fibromyalgia [M79.7] 10/16/2014  . PTSD (post-traumatic stress disorder) [F43.10] 10/16/2014  . Status post cervical spinal fusion [Z98.1] 10/16/2014  . Depression with anxiety [F41.8] 10/16/2014  . Obesity, Class I, BMI 30-34.9 [E66.9] 10/16/2014  . Hx of opioid abuse [Z87.898] 10/16/2014  . Barrett esophagus [K22.70] 06/11/2014  . Acid reflux [K21.9] 06/11/2014  . Dysphagia, pharyngeal [R13.13] 02/24/2014   History of Present Illness:    Pt is a 55 yo married female who presented for follow up. . She reported that she Is doing well and has been taking her medications as start. She reported that she has been seeing her primary care physician Dr. Thedore Mins who is prescribing her Paxil and Ambien. She is sleeping well with the combination of Seroquel and Ambien. Patient wants to go higher on the dose of her will at this time. Patient reported that she is planning to go to [place] for the holidays. She appeared calm and alert during the interview. She is doing physical therapy for her shoulder. She reported that she is not planning to have  the surgery at this time. We discussed about her medications at this time. She appeared calm and alert. No acute issues noted.    She denied having any suicidal ideations or plans. She denied having any perceptual disturbances at this time.     Past Medical History:  Past Medical History:  Diagnosis Date  . Anxiety   . Barrett's esophagus   .  Cervical disc disease   . Esophageal reflux   . Opioid abuse   . Phlebitis   . PTSD (post-traumatic stress disorder)     Past Surgical History:  Procedure Laterality Date  . CERVICAL SPINE SURGERY    . CESAREAN SECTION     x 2  . COLONOSCOPY  2014   no polyps- cleared for 10 yrs- Dr Colette Ribas  . CYST REMOVAL HAND Right    thumb  . FINGER SURGERY    . HIP SURGERY Left    x 2  . SHOULDER SURGERY    . WRIST SURGERY Right    DeQuervain surg   Family History:  Family History  Problem Relation Age of Onset  . Lung cancer Mother 41  . Alcohol abuse Mother   . Cancer - Other Mother   . Lung cancer Maternal Grandmother   . Breast cancer Sister 22    age 14 and 32  . Prostate cancer Father 39  . Alcohol abuse Brother   . Drug abuse Brother   . Prostate cancer Maternal Uncle 60  . Bladder Cancer Maternal Grandmother 72  . Lung cancer Paternal Grandmother 39   Social History:   Social History   Social History  . Marital status: Married    Spouse name: N/A  . Number of children: 2  . Years of education: N/A   Occupational History  . unemployed    Social History Main Topics  . Smoking status:  Former Smoker    Packs/day: 0.10    Types: Cigarettes    Start date: 10/07/1977    Quit date: 01/02/2013  . Smokeless tobacco: Never Used  . Alcohol use No  . Drug use: No     Comment: uses to smoke marjuana over a year ago  . Sexual activity: Yes    Partners: Male    Birth control/ protection: None, Post-menopausal   Other Topics Concern  . None   Social History Narrative  . None   Additional Social History:   Married x 3 years. No children. 2 children from previous marriage. Husband works in [employer] representative[employer]. Moved to [place] - 6 years ago. [place] was [place] She has been married x 3. Has no pending legal charges.   Musculoskeletal: Strength & Muscle Tone: within normal limits Gait & Station: normal Patient leans: N/A  Psychiatric Specialty  Exam: Anxiety  Symptoms include nervous/anxious behavior and palpitations.      Review of Systems  Cardiovascular: Positive for palpitations.  Musculoskeletal: Positive for neck pain.  Psychiatric/Behavioral: Positive for depression. The patient is nervous/anxious.   All other systems reviewed and are negative.   Blood pressure 102/64, pulse 98, height 5\' 2"  (1.575 m), weight 137 lb 3.2 oz (62.2 kg).Body mass index is 25.09 kg/m.  General Appearance: Casual  Eye Contact:  Fair  Speech:  Slow  Volume:  Normal  Mood:  Euthymic  Affect:  Congruent  Thought Process:  Coherent and Logical  Orientation:  Full (Time, Place, and Person)  Thought Content:  WDL  Suicidal Thoughts:  No  Homicidal Thoughts:  No  Memory:  Immediate;   Fair  Judgement:  Fair  Insight:  Fair  Psychomotor Activity:  Normal  Concentration:  Fair  Recall:  [employer]Fair  Fund of Knowledge:Fair  Language: Fair  Akathisia:  No  Handed:  Right  AIMS (if indicated):  none  Assets:  Communication Skills Desire for Improvement  ADL's:  Intact  Cognition: WNL  Sleep:  5   Is the patient at risk to self?  No. Has the patient been a risk to self in the past 6 months?  No. Has the patient been a risk to self within the distant past?  No. Is the patient a risk to others?  No. Has the patient been a risk to others in the past 6 months?  No. Has the patient been a risk to others within the distant past?  No.  Allergies:   Allergies  Allergen Reactions  . Ciprofloxacin Other (See Comments)    Ruptured tendon CAUSED DAMAGE TO TENDONS IN HANDS  . Dexbrompheniramine-Pseudoeph Hives  . Formaldehyde Hives  . Other Hives  . Propoxyphene Rash  . Propoxyphene Rash  . Tetracycline Hives  . Tetracyclines & Related Hives  . Tetracycline Hcl   . Tramadol Hives   Current Medications: Current Outpatient Prescriptions  Medication Sig Dispense Refill  . baclofen (LIORESAL) 10 MG tablet TK 1 T PO TID.  0  .  Butalbital-APAP-Caffeine 50-325-40 MG capsule Take by mouth.    . Cholecalciferol (VITAMIN D) 2000 UNITS CAPS Take 1 capsule (2,000 Units total) by mouth daily. 30 capsule   . Cyanocobalamin (VITAMIN B 12) 100 MCG LOZG Take by mouth.    . gabapentin (NEURONTIN) 300 MG capsule Take 1-2 capsules at bedtime as directed.    . hyoscyamine (LEVBID) 0.375 MG 12 hr tablet   0  . nystatin cream (MYCOSTATIN) APP TOPICALLY BID  0  . omeprazole (PRILOSEC) 20 MG capsule TK ONE C PO QD  11  . PARoxetine (PAXIL) 10 MG tablet TAKE 1 TABLET BY MOUTH EVERY DAY    . QUEtiapine (SEROQUEL) 100 MG tablet Take 1 tablet (100 mg total) by mouth at bedtime. 30 tablet 2  . zolpidem (AMBIEN) 5 MG tablet TAKE 1 TABLET BY MOUTH AT BEDTIME AS NEEDED FOR SLEEP     No current facility-administered medications for this visit.       Substance Abuse History in the last 12 months:  No.  Consequences of Substance Abuse: Negative  Medical Decision Making:  Established Problem, Worsening (2)  Treatment Plan Summary: Medication management   Discussed with patient or the medications treatment risk benefits and alternatives  Mood symptoms She will continue on Seroquel 100  mg at bedtime  she is also taking Paxil 10 mg daily and Ambien 5 mg as prescribed by her primary care physician  She will  follow-up in 3 months  or earlier  depending on her symptoms    This note was generated in part or whole with voice recognition software. Voice regonition is usually quite accurate but there are transcription errors that can and very often do occur. I apologize for any typographical errors that were not detected and corrected.    Brandy HaleUzma Esmeralda Blanford, MD    11/21/201711:18 AM

## 2016-02-23 ENCOUNTER — Ambulatory Visit: Payer: 59 | Admitting: Psychiatry

## 2016-02-28 ENCOUNTER — Telehealth: Payer: Self-pay

## 2016-02-28 NOTE — Telephone Encounter (Signed)
pt called states she will not have enough medication to last until her next appt.

## 2016-02-28 NOTE — Telephone Encounter (Signed)
left message on doctor's line to refill seroquel 100mg  #30 with no additional refills.

## 2016-03-20 ENCOUNTER — Ambulatory Visit: Payer: 59 | Admitting: Psychiatry

## 2016-04-13 IMAGING — RF DG BARIUM SWALLOW
1 series · 10 of 10 positions shown · non-contrast
Comparison: None.

CLINICAL DATA: History of Barrett's esophagus, intermittent
esophageal spasm symptoms, cervical fusion in 9838 with sensation of
pressure in the upper esophagus

EXAM:
ESOPHOGRAM / BARIUM SWALLOW / BARIUM TABLET STUDY
TECHNIQUE: Combined double contrast and single contrast examination performed
using effervescent crystals, thick barium liquid, and thin barium
liquid. The patient was observed with fluoroscopy swallowing a 13mm
barium sulphate tablet.
FLUOROSCOPY TIME:  1 minutes, 12 seconds

[Series 1: fluoro_barium singleshot_bw · 0.18mm/px · 10 of 10 slices shown]
[im 1/10]
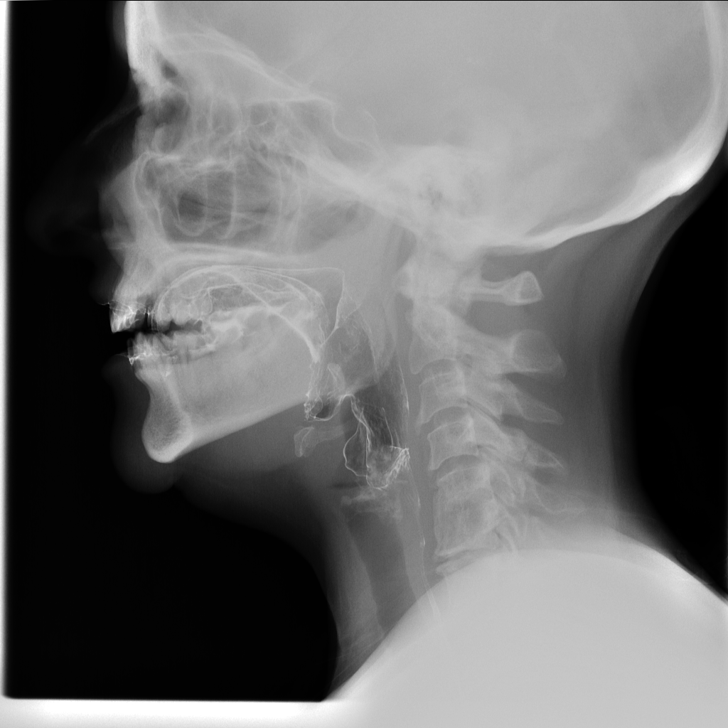
[im 2/10]
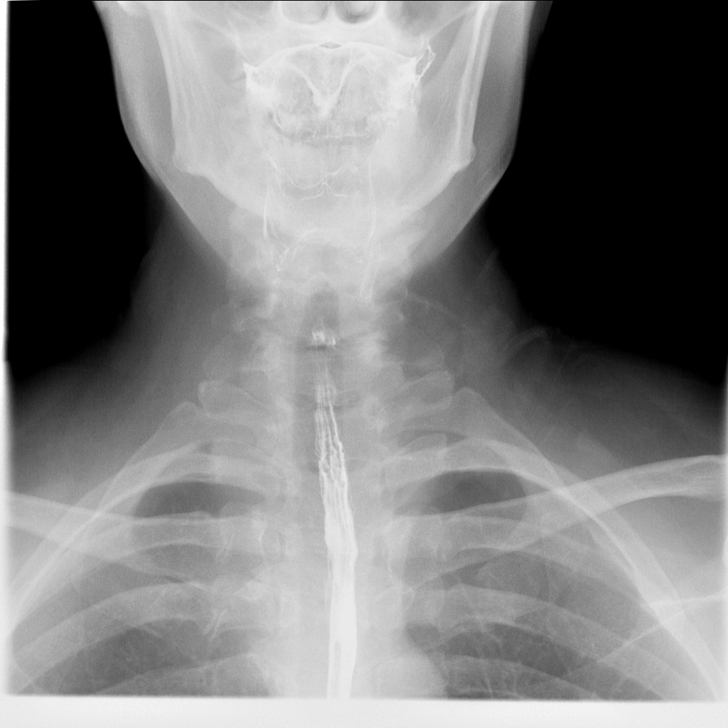
[im 3/10]
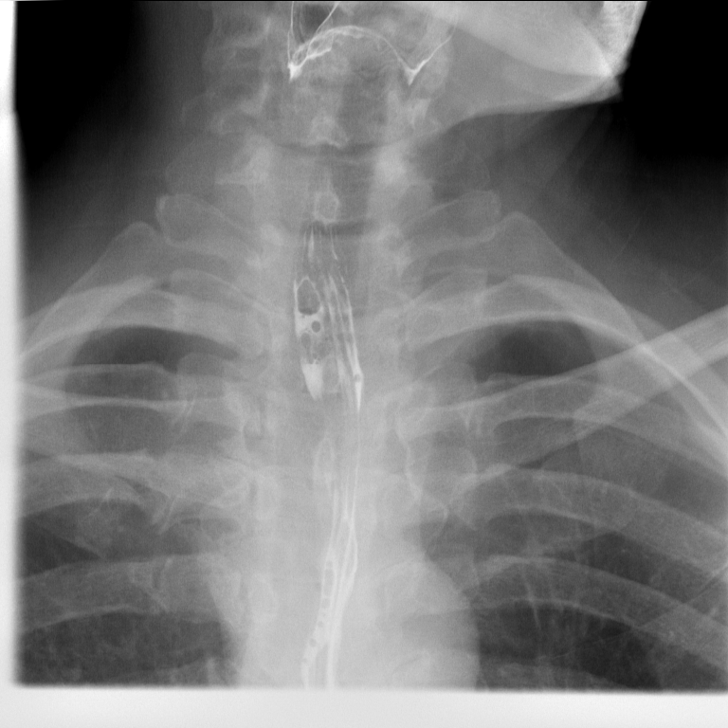
[im 4/10]
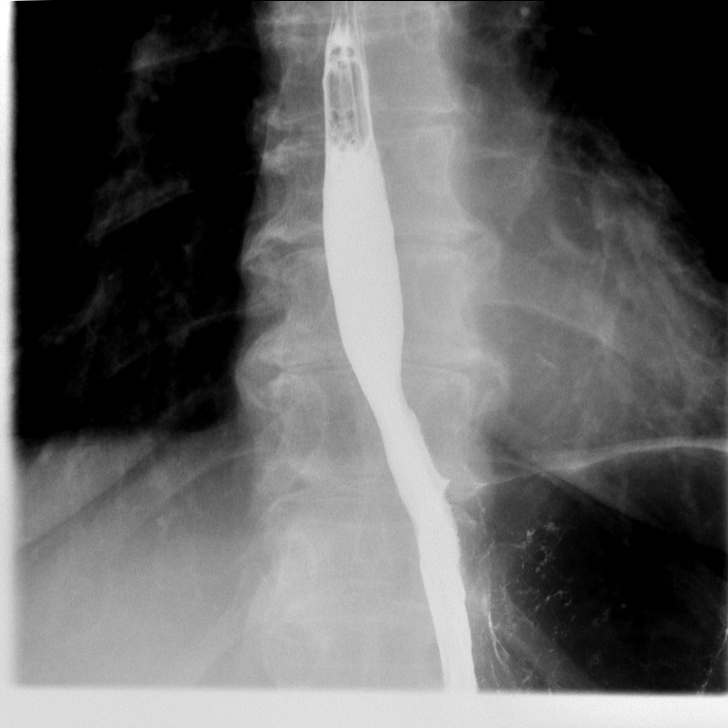
[im 5/10]
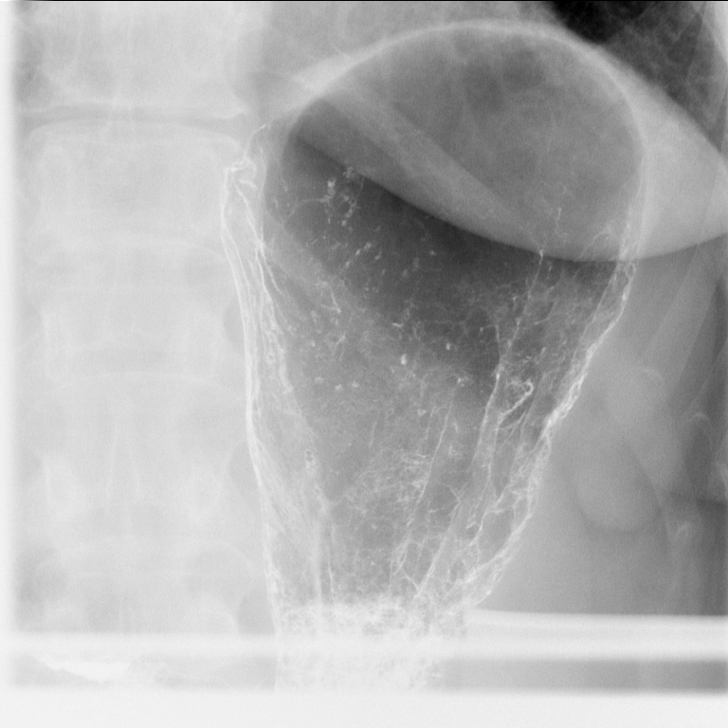
[im 6/10]
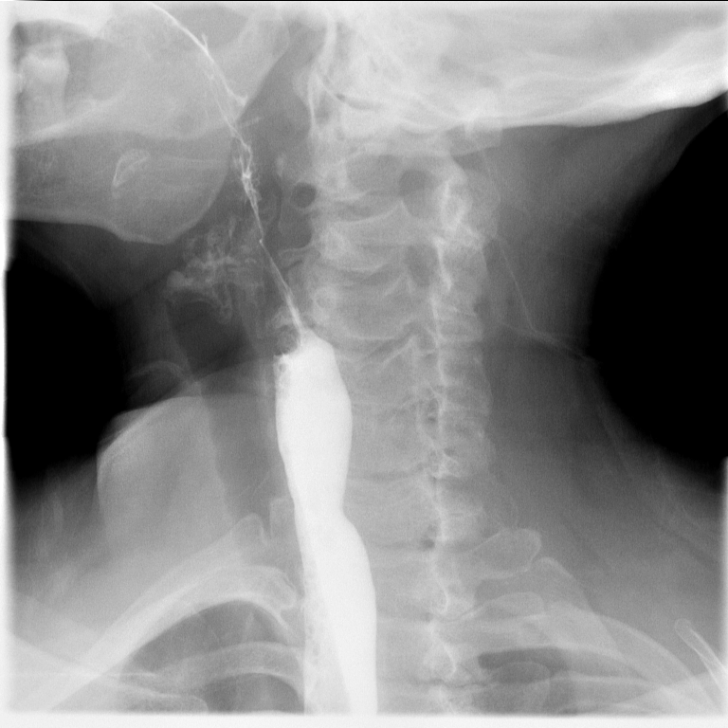
[im 7/10]
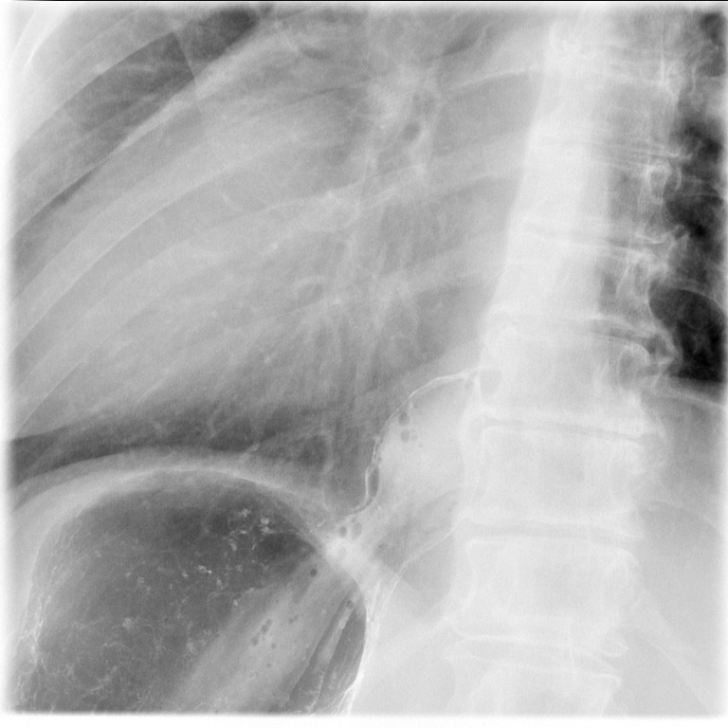
[im 8/10]
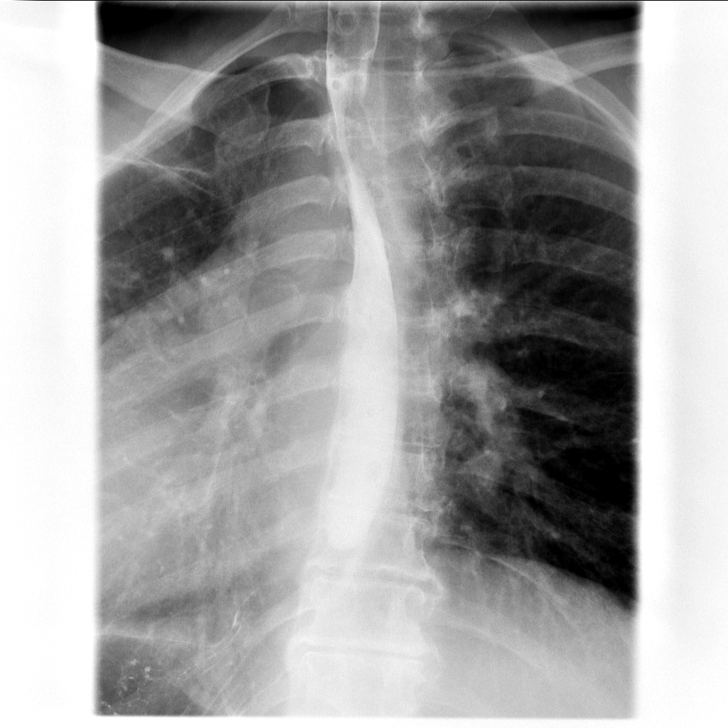
[im 9/10]
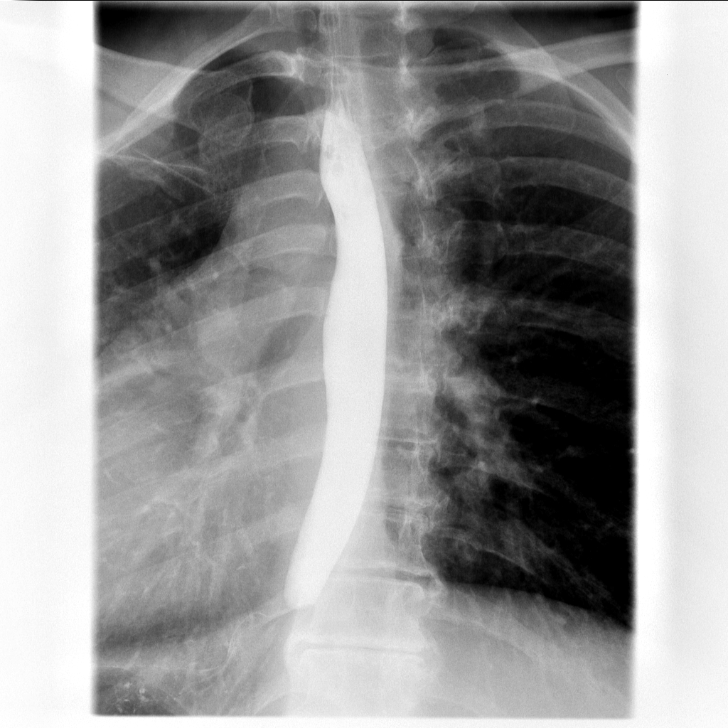
[im 10/10]
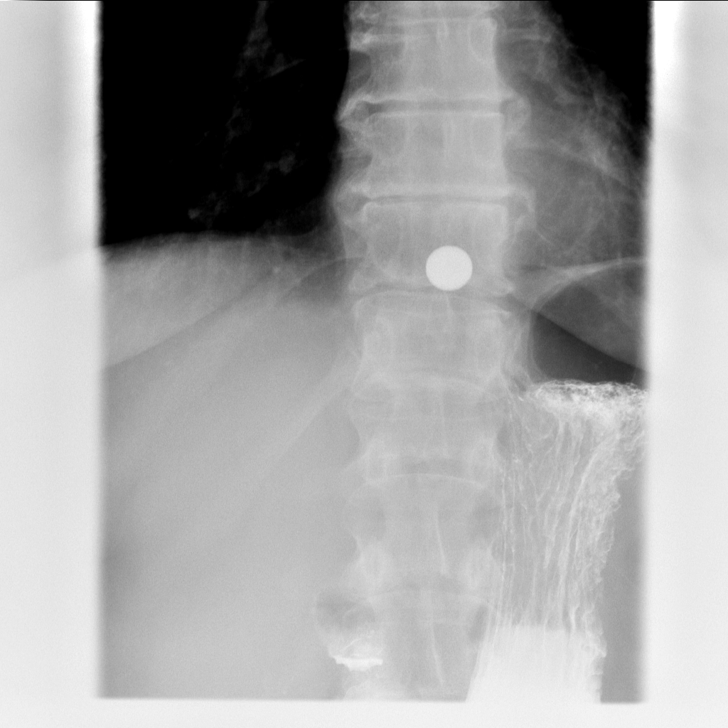

[10 of 10 positions shown; findings below may reference images not displayed]

FINDINGS: The cervical esophagus distended well. The patient has undergone
bony fusion at C5-6. There is small anterior endplate osteophytes at
C6-7. A very mild ventral impression is made upon the posterior wall
of the proximal thoracic esophagus during each swallowing maneuver.
There is no laryngeal penetration of the barium.

The thoracic esophagus distended well. The mucosal pattern was
normal. There was a small reducible hiatal hernia. Reflux was
observed. No mucosal irregularities were demonstrated. The 12 mm
barium tablet passed to the distal esophagus and hung there are
prior to entering the hiatal hernia.
IMPRESSION: 1. There is minimal posterior and impression upon the proximal
esophagus at C6-7 due to anterior endplate osteophytes. No
obstruction to passage of the barium tablet is demonstrated.
2. There is a small non reducible hiatal hernia without evidence of
esophagitis or other mucosal lesions. There is transient obstruction
of passage of the barium tablet at the GE junction. A small amount
of reflux was observed.

## 2016-12-06 ENCOUNTER — Other Ambulatory Visit: Payer: Self-pay | Admitting: Psychiatry
